# Patient Record
Sex: Female | Born: 1995 | Race: White | Hispanic: No | Marital: Single | Smoking: Never smoker
Health system: Southern US, Community
[De-identification: ages and names within clinical notes are randomized; demographics above are authoritative.]

---
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
## 2018-10-24 ENCOUNTER — Ambulatory Visit: Payer: Self-pay | Admitting: Family Medicine

## 2018-10-24 VITALS — BP 112/74 | HR 107 | Temp 98.2°F | Resp 20 | Wt 111.4 lb

## 2018-10-24 DIAGNOSIS — J019 Acute sinusitis, unspecified: Secondary | ICD-10-CM

## 2018-10-24 MED ORDER — IPRATROPIUM BROMIDE 0.03 % NA SOLN: 2.0000 | Freq: Two times a day (BID) | NASAL | 0 refills | Status: DC

## 2018-10-24 MED ORDER — AMOXICILLIN-POT CLAVULANATE 875-125 MG PO TABS: 1.0000 | ORAL_TABLET | Freq: Two times a day (BID) | ORAL | 0 refills | Status: DC

## 2018-10-24 MED ORDER — BENZONATATE 100 MG PO CAPS: 100.0000 mg | ORAL_CAPSULE | Freq: Three times a day (TID) | ORAL | 0 refills | Status: DC | PRN

## 2018-10-24 NOTE — Patient Instructions (Signed)

## 2018-10-24 NOTE — Progress Notes (Signed)
Patient ID: Dana Morgan, female    DOB: 04-29-1996, 22 y.o.   MRN: [id number]  PCP: No primary care provider on file.  Chief Complaint  Patient presents with  . Self Pay- 3 weeks sore throat, cough    Subjective:  HPI Dana Morgan is a 22 y.o. female presents for evaluation nasal congestion, cough occasionally productive, fatigue, sore throat and headache x3 weeks intermittently. Denies fever.  Has attempted relief with over-the-counter medications without any improvement of symptoms. No history of asthma and no tobacco use. Denies chest tightness, wheezing, or shortness of breath. Social History   Socioeconomic History  . Marital status: Single    Spouse name: Not on file  . Number of children: Not on file  . Years of education: Not on file  . Highest education level: Not on file  Occupational History  . Not on file  Social Needs  . Financial resource strain: Not on file  . Food insecurity:    Worry: Not on file    Inability: Not on file  . Transportation needs:    Medical: Not on file    Non-medical: Not on file  Tobacco Use  . Smoking status: Not on file  Substance and Sexual Activity  . Alcohol use: Not on file  . Drug use: Not on file  . Sexual activity: Not on file  Lifestyle  . Physical activity:    Days per week: Not on file    Minutes per session: Not on file  . Stress: Not on file  Relationships  . Social connections:    Talks on phone: Not on file    Gets together: Not on file    Attends religious service: Not on file    Active member of club or organization: Not on file    Attends meetings of clubs or organizations: Not on file    Relationship status: Not on file  . Intimate partner violence:    Fear of current or ex partner: Not on file    Emotionally abused: Not on file    Physically abused: Not on file    Forced sexual activity: Not on file  Other Topics Concern  . Not on file  Social History Narrative  . Not on file   Review of  Systems Pertinent negatives listed in HPI There are no active problems to display for this patient.   Allergies  Allergen Reactions  . Ciprofloxacin     Prior to Admission medications   Medication Sig Start Date End Date Taking? Authorizing Provider  drospirenone-ethinyl estradiol (YAZ,GIANVI,LORYNA) 3-0.02 MG tablet Take 1 tablet by mouth daily.   Yes [provider]    Past Medical, Surgical Family and Social History reviewed and updated.    Objective:   Today's Vitals   10/24/18 1028  BP: 112/74  Pulse: (!) 107  Resp: 20  Temp: 98.2 F (36.8 C)  TempSrc: Oral  SpO2: 97%  Weight: 111 lb 6.4 oz (50.5 kg)    Wt Readings from Last 3 Encounters:  10/24/18 111 lb 6.4 oz (50.5 kg)   Physical Exam General Appearance:    Alert, cooperative, no distress  HENT:   Neck without nodes, pharynx erythematous without exudate, post nasal drip noted, nasal mucosa congested and nares erythematous   Eyes:    PERRL, conjunctiva/corneas clear, EOM's intact       Lungs:     Clear to auscultation bilaterally, respirations unlabored  Heart:    Regular rate and rhythm  Neurologic:   Awake, alert, oriented x 3. No apparent focal neurological           defect.          Assessment & Plan:  1. Acute sinusitis, recurrence not specified, unspecified location, uncomplicated Start Augmentin 1 tablet every 12 hours x 10 days.  Ipratropium (Atrovent) 2 sprays, twice daily. For appropriate administration of the nasal spray, clear the nose, use opposite hand for opposite nare, sniff gently, exhale through your mouth.   Take Benzonatate 100-200 mg 3 times daily as needed.   If symptoms worsen or do not improve, return for follow-up, follow-up with PCP, or at the emergency department if severity of symptoms warrant a higher level of care.   Godfrey PickKimberly S. Tiburcio PeaHarris, MSN, FNP-C [facility] [id number]  [place], [place] [address] [id number]

## 2019-06-17 ENCOUNTER — Encounter (HOSPITAL_COMMUNITY): Payer: Self-pay | Admitting: Emergency Medicine

## 2019-06-17 ENCOUNTER — Emergency Department (HOSPITAL_COMMUNITY)
Admission: EM | Admit: 2019-06-17 | Discharge: 2019-06-17 | Disposition: A | Discharge status: Home / Self Care | Payer: BC Managed Care – PPO | Attending: Emergency Medicine | Admitting: Emergency Medicine

## 2019-06-17 ENCOUNTER — Other Ambulatory Visit: Payer: Self-pay

## 2019-06-17 ENCOUNTER — Emergency Department (HOSPITAL_COMMUNITY): Payer: BC Managed Care – PPO

## 2019-06-17 DIAGNOSIS — Z79899 Other long term (current) drug therapy: Secondary | ICD-10-CM | POA: Insufficient documentation

## 2019-06-17 DIAGNOSIS — Y9289 Other specified places as the place of occurrence of the external cause: Secondary | ICD-10-CM | POA: Insufficient documentation

## 2019-06-17 DIAGNOSIS — Y999 Unspecified external cause status: Secondary | ICD-10-CM | POA: Insufficient documentation

## 2019-06-17 DIAGNOSIS — T148XXA Other injury of unspecified body region, initial encounter: Secondary | ICD-10-CM | POA: Insufficient documentation

## 2019-06-17 DIAGNOSIS — Y9355 Activity, bike riding: Secondary | ICD-10-CM | POA: Insufficient documentation

## 2019-06-17 DIAGNOSIS — T07XXXA Unspecified multiple injuries, initial encounter: Secondary | ICD-10-CM

## 2019-06-17 DIAGNOSIS — Z23 Encounter for immunization: Secondary | ICD-10-CM | POA: Diagnosis not present

## 2019-06-17 DIAGNOSIS — M7918 Myalgia, other site: Secondary | ICD-10-CM

## 2019-06-17 LAB — URINALYSIS, ROUTINE W REFLEX MICROSCOPIC
Bilirubin Urine: NEGATIVE
Glucose, UA: NEGATIVE mg/dL
Hgb urine dipstick: NEGATIVE
Ketones, ur: NEGATIVE mg/dL
Leukocytes,Ua: NEGATIVE
Nitrite: NEGATIVE
Protein, ur: NEGATIVE mg/dL
Specific Gravity, Urine: 1.009 (ref 1.005–1.030)
pH: 8 (ref 5.0–8.0)

## 2019-06-17 LAB — PREGNANCY, URINE: Preg Test, Ur: NEGATIVE

## 2019-06-17 MED ORDER — BACITRACIN ZINC 500 UNIT/GM EX OINT
TOPICAL_OINTMENT | Freq: Once | CUTANEOUS | Status: DC
  Filled 2019-06-17: qty 6.3

## 2019-06-17 MED ORDER — TETANUS-DIPHTH-ACELL PERTUSSIS 5-2.5-18.5 LF-MCG/0.5 IM SUSP
0.5000 mL | Freq: Once | INTRAMUSCULAR | Status: AC
  Administered 2019-06-17: 13:00:00 0.5 mL via INTRAMUSCULAR
  Filled 2019-06-17: qty 0.5
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
## 2020-10-20 ENCOUNTER — Other Ambulatory Visit: Payer: Medicaid Other

## 2020-10-20 ENCOUNTER — Other Ambulatory Visit: Payer: Self-pay

## 2020-10-20 DIAGNOSIS — Z20822 Contact with and (suspected) exposure to covid-19: Secondary | ICD-10-CM

## 2020-10-22 LAB — SPECIMEN STATUS REPORT

## 2020-10-22 LAB — SARS-COV-2, NAA 2 DAY TAT

## 2020-10-22 LAB — NOVEL CORONAVIRUS, NAA: SARS-CoV-2, NAA: NOT DETECTED
